# Patient Record
Sex: Female | Born: 1990
Health system: Southern US, Community
[De-identification: ages and names within clinical notes are randomized; demographics above are authoritative.]

---

## 2011-12-24 ENCOUNTER — Encounter (HOSPITAL_BASED_OUTPATIENT_CLINIC_OR_DEPARTMENT_OTHER): Payer: Self-pay | Admitting: *Deleted

## 2011-12-24 ENCOUNTER — Emergency Department (INDEPENDENT_AMBULATORY_CARE_PROVIDER_SITE_OTHER): Payer: Self-pay

## 2011-12-24 ENCOUNTER — Other Ambulatory Visit: Payer: Self-pay

## 2011-12-24 ENCOUNTER — Emergency Department (HOSPITAL_BASED_OUTPATIENT_CLINIC_OR_DEPARTMENT_OTHER)
Admission: EM | Admit: 2011-12-24 | Discharge: 2011-12-25 | Disposition: A | Discharge status: Home / Self Care | Payer: Self-pay | Attending: Emergency Medicine | Admitting: Emergency Medicine

## 2011-12-24 DIAGNOSIS — R079 Chest pain, unspecified: Secondary | ICD-10-CM | POA: Insufficient documentation

## 2011-12-24 DIAGNOSIS — R091 Pleurisy: Secondary | ICD-10-CM | POA: Insufficient documentation

## 2011-12-24 MED ORDER — TRAMADOL HCL 50 MG PO TABS
50.0000 mg | ORAL_TABLET | Freq: Once | ORAL | Status: AC
  Administered 2011-12-24: 50 mg via ORAL
  Filled 2011-12-24: qty 1

## 2011-12-24 NOTE — ED Provider Notes (Addendum)
History    This chart was scribed for Keimon Basaldua Smitty Cords, MD, MD by Smitty Pluck. The patient was seen in room MH10 and the patient's care was started at 11:19PM.   CSN: [id number]  Arrival date & time 12/24/11  2253   First MD Initiated Contact with Patient 12/24/11 2305      Chief Complaint  Patient presents with  . Chest Pain    (Consider location/radiation/quality/duration/timing/severity/associated sxs/prior treatment) Patient is a 21 y.o. female presenting with chest pain. The history is provided by the patient. No language interpreter was used.  Chest Pain The chest pain began 5 - 7 days ago. Chest pain occurs constantly. The chest pain is unchanged. The pain is associated with breathing. At its most intense, the pain is at 10/10. The pain is currently at 10/10. The severity of the pain is severe. The quality of the pain is described as pleuritic and sharp. The pain does not radiate. Chest pain is worsened by deep breathing. Pertinent negatives for primary symptoms include no fever, no syncope, no shortness of breath, no cough, no palpitations, no nausea and no dizziness.  Pertinent negatives for associated symptoms include no claudication and no near-syncope. She tried nothing for the symptoms. Risk factors include no known risk factors.  Pertinent negatives for past medical history include no Marfan's syndrome.  Pertinent negatives for family medical history include: no Marfan's syndrome in family.  Procedure history is negative for cardiac catheterization.    Hannah Riddle is a 21 y.o. female who presents to the Emergency Department complaining of constant sharp left side chest pain onset 2 days ago. Pt reports that pain occurred while eating. Denies fever, chills, recent trips, chest injury, wheezing, sore throat, diarrhea. Pt has nausea and vomiting 1x. Pt has hx of asthma. Denies taking oral contraceptives. Pt has taken alieve for pain without relief.  There is no radiation of  pain.    Past Medical History  Diagnosis Date  . Asthma     History reviewed. No pertinent past surgical history.  No family history on file.  History  Substance Use Topics  . Smoking status: Never Smoker   . Smokeless tobacco: Never Used  . Alcohol Use: No    OB History    Grav Para Term Preterm Abortions TAB SAB Ect Mult Living                  Review of Systems  Constitutional: Negative.  Negative for fever.  HENT: Negative.   Eyes: Negative.   Respiratory: Negative for cough, chest tightness and shortness of breath.   Cardiovascular: Positive for chest pain. Negative for palpitations, claudication, leg swelling, syncope and near-syncope.  Gastrointestinal: Negative.  Negative for nausea.  Genitourinary: Negative.   Musculoskeletal: Negative.   Skin: Negative.   Neurological: Negative.  Negative for dizziness.  Hematological: Negative.   Psychiatric/Behavioral: Negative.   All other systems reviewed and are negative.   10 Systems reviewed and are negative for acute change except as noted in the HPI.  Allergies  Review of patient's allergies indicates no known allergies.  Home Medications  No current outpatient prescriptions on file.  BP 118/74  Pulse 77  Temp(Src) 98.2 F (36.8 C) (Oral)  Resp 21  SpO2 98%  LMP 12/10/2011  Physical Exam  Nursing note and vitals reviewed. Constitutional: She is oriented to person, place, and time. She appears well-developed and well-nourished. No distress.  HENT:  Head: Normocephalic and atraumatic.  Mouth/Throat: Oropharynx is clear and  moist.  Eyes: Conjunctivae are normal. Pupils are equal, round, and reactive to light.  Neck: Normal range of motion. Neck supple. No tracheal deviation present.  Cardiovascular: Normal rate, regular rhythm and normal heart sounds.   No murmur heard.      +2 radial in wrist +2 dp in foot   Pulmonary/Chest: Effort normal and breath sounds normal. No respiratory distress. She has  no wheezes. She has no rales.       lungs clear of auscultation  Abdominal: Soft. Bowel sounds are normal. She exhibits no distension. There is no tenderness.  Neurological: She is alert and oriented to person, place, and time.       GCS 15  Skin: Skin is warm and dry.  Psychiatric: She has a normal mood and affect. Her behavior is normal.    ED Course  Procedures (including critical care time) .DIAGNOSTIC STUDIES: Oxygen Saturation is 98% on room air, normal by my interpretation.    COORDINATION OF CARE: 11:24PM EDP discusses pt ED treatment course with pt   Labs Reviewed - No data to display No results found.   No diagnosis found.    MDM  I personally performed the services described in this documentation, which was scribed in my presence. The recorded information has been reviewed and considered.  Symptoms constant x > 5 days with normal EKG in a young female very unlikely cardiac.  Negative DDIMEr very reassuring.   Return for chest pain shortness of breath or any concerns.Follow up with your family doctor  Patient verbalizes understanding and agrees to follow up     Date: 12/25/2011  Rate: 62  Rhythm: sinus arrhythmia  QRS Axis: normal  Intervals: PR prolonged  ST/T Wave abnormalities: normal  Conduction Disutrbances:first-degree A-V block   Narrative Interpretation:   Old EKG Reviewed: none available     Lehi Phifer K Willian Donson-Rasch, MD 12/25/11 0213  Jasmine Awe, MD 12/25/11 [phone]

## 2011-12-24 NOTE — ED Notes (Signed)
MD at bedside. 

## 2011-12-24 NOTE — ED Notes (Signed)
Pt reports left side chest pain x 2 days, worse with inspiration- hx of asthma

## 2011-12-25 DIAGNOSIS — J45909 Unspecified asthma, uncomplicated: Secondary | ICD-10-CM

## 2011-12-25 DIAGNOSIS — R112 Nausea with vomiting, unspecified: Secondary | ICD-10-CM

## 2011-12-25 DIAGNOSIS — R079 Chest pain, unspecified: Secondary | ICD-10-CM

## 2011-12-25 LAB — DIFFERENTIAL
Basophils Absolute: 0 10*3/uL (ref 0.0–0.1)
Eosinophils Relative: 2 % (ref 0–5)
Lymphocytes Relative: 42 % (ref 12–46)
Monocytes Absolute: 0.7 10*3/uL (ref 0.1–1.0)
Monocytes Relative: 12 % (ref 3–12)
Neutro Abs: 2.6 10*3/uL (ref 1.7–7.7)

## 2011-12-25 LAB — CBC
HCT: 34.2 % — ABNORMAL LOW (ref 36.0–46.0)
Hemoglobin: 10.8 g/dL — ABNORMAL LOW (ref 12.0–15.0)
MCHC: 31.6 g/dL (ref 30.0–36.0)
MCV: 77.9 fL — ABNORMAL LOW (ref 78.0–100.0)
RDW: 12.8 % (ref 11.5–15.5)
WBC: 6 10*3/uL (ref 4.0–10.5)

## 2011-12-25 LAB — BASIC METABOLIC PANEL
BUN: 22 mg/dL (ref 6–23)
CO2: 25 mEq/L (ref 19–32)
Calcium: 9.3 mg/dL (ref 8.4–10.5)
Chloride: 105 mEq/L (ref 96–112)
Creatinine, Ser: 0.8 mg/dL (ref 0.50–1.10)

## 2011-12-25 LAB — D-DIMER, QUANTITATIVE: D-Dimer, Quant: 0.46 ug/mL-FEU (ref 0.00–0.48)

## 2011-12-25 MED ORDER — TRAMADOL HCL 50 MG PO TABS: 50.0000 mg | ORAL_TABLET | Freq: Four times a day (QID) | ORAL | Status: AC | PRN

## 2011-12-25 MED ORDER — IBUPROFEN 600 MG PO TABS: 600.0000 mg | ORAL_TABLET | Freq: Four times a day (QID) | ORAL | Status: AC | PRN

## 2011-12-25 NOTE — ED Notes (Signed)
rx x 2 given for tramadol and ibuprofen

## 2011-12-25 NOTE — Discharge Instructions (Signed)
Pleurisy  Pleurisy is an inflammation and swelling of the lining of the lungs. It usually is the result of an underlying infection or other disease. Because of this inflammation, it hurts to breathe. It is aggravated by coughing or deep breathing. The primary goal in treating pleurisy is to diagnose and treat the condition that caused it.   HOME CARE INSTRUCTIONS    Only take over-the-counter or prescription medicines for pain, discomfort, or fever as directed by your caregiver.   If medications which kill germs (antibiotics) were prescribed, take the entire course. Even if you are feeling better, you need to take them.   Use a cool mist vaporizer to help loosen secretions. This is so the secretions can be coughed up more easily.  SEEK MEDICAL CARE IF:    Your pain is not controlled with medication or is increasing.   You have an increase inpus like (purulent) secretions brought up with coughing.  SEEK IMMEDIATE MEDICAL CARE IF:    You have blue or dark lips, fingernails, or toenails.   You begin coughing up blood.   You have increased difficulty breathing.   You have continuing pain unrelieved by medicine or lasting more than 1 week.   You have pain that radiates into your neck, arms, or jaw.   You develop increased shortness of breath or wheezing.   You develop a fever, rash, vomiting, fainting, or other serious complaints.  Document Released: 09/25/2005 Document Revised: 09/14/2011 Document Reviewed: 04/26/2007  [employer] Patient Information 2012 [employer].

## 2014-01-09 ENCOUNTER — Emergency Department (HOSPITAL_BASED_OUTPATIENT_CLINIC_OR_DEPARTMENT_OTHER)
Admission: EM | Admit: 2014-01-09 | Discharge: 2014-01-09 | Disposition: A | Discharge status: Home / Self Care | Payer: No Typology Code available for payment source | Attending: Emergency Medicine | Admitting: Emergency Medicine

## 2014-01-09 ENCOUNTER — Encounter (HOSPITAL_BASED_OUTPATIENT_CLINIC_OR_DEPARTMENT_OTHER): Payer: Self-pay | Admitting: Emergency Medicine

## 2014-01-09 DIAGNOSIS — S40029A Contusion of unspecified upper arm, initial encounter: Secondary | ICD-10-CM | POA: Insufficient documentation

## 2014-01-09 DIAGNOSIS — J45909 Unspecified asthma, uncomplicated: Secondary | ICD-10-CM | POA: Insufficient documentation

## 2014-01-09 DIAGNOSIS — S199XXA Unspecified injury of neck, initial encounter: Secondary | ICD-10-CM

## 2014-01-09 DIAGNOSIS — Z79899 Other long term (current) drug therapy: Secondary | ICD-10-CM | POA: Insufficient documentation

## 2014-01-09 DIAGNOSIS — Y9389 Activity, other specified: Secondary | ICD-10-CM | POA: Insufficient documentation

## 2014-01-09 DIAGNOSIS — IMO0002 Reserved for concepts with insufficient information to code with codable children: Secondary | ICD-10-CM | POA: Insufficient documentation

## 2014-01-09 DIAGNOSIS — S0993XA Unspecified injury of face, initial encounter: Secondary | ICD-10-CM | POA: Insufficient documentation

## 2014-01-09 DIAGNOSIS — Y92410 Unspecified street and highway as the place of occurrence of the external cause: Secondary | ICD-10-CM | POA: Insufficient documentation

## 2014-01-09 MED ORDER — DIAZEPAM 5 MG PO TABS: 5.0000 mg | ORAL_TABLET | Freq: Two times a day (BID) | ORAL | Status: DC
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
## 2014-08-08 ENCOUNTER — Emergency Department (HOSPITAL_BASED_OUTPATIENT_CLINIC_OR_DEPARTMENT_OTHER)
Admission: EM | Admit: 2014-08-08 | Discharge: 2014-08-09 | Disposition: A | Discharge status: Home / Self Care | Payer: No Typology Code available for payment source | Attending: Emergency Medicine | Admitting: Emergency Medicine

## 2014-08-08 ENCOUNTER — Encounter (HOSPITAL_BASED_OUTPATIENT_CLINIC_OR_DEPARTMENT_OTHER): Payer: Self-pay | Admitting: Emergency Medicine

## 2014-08-08 DIAGNOSIS — J45901 Unspecified asthma with (acute) exacerbation: Secondary | ICD-10-CM | POA: Insufficient documentation

## 2014-08-08 DIAGNOSIS — Z79899 Other long term (current) drug therapy: Secondary | ICD-10-CM | POA: Insufficient documentation

## 2014-08-08 MED ORDER — IPRATROPIUM-ALBUTEROL 0.5-2.5 (3) MG/3ML IN SOLN
3.0000 mL | RESPIRATORY_TRACT | Status: DC
  Administered 2014-08-08: 3 mL via RESPIRATORY_TRACT
  Filled 2014-08-08: qty 3

## 2014-08-08 MED ORDER — ALBUTEROL SULFATE (2.5 MG/3ML) 0.083% IN NEBU
2.5000 mg | INHALATION_SOLUTION | Freq: Once | RESPIRATORY_TRACT | Status: AC
  Administered 2014-08-08: 2.5 mg via RESPIRATORY_TRACT
  Filled 2014-08-08: qty 3

## 2014-08-08 NOTE — ED Provider Notes (Signed)
CSN: [id number]     Arrival date & time 08/08/14  2321 History  This chart was scribed for Hanley SeamenJohn L Azure Budnick, MD by Annye AsaAnna Dorsett, ED Scribe. This patient was seen in room MH09/MH09 and the patient's care was started at 11:35 PM.    Chief Complaint  Patient presents with  . Shortness of Breath   HPI  HPI Comments: Hannah Riddle is a 23 y.o. female with past medical history of asthma who presents to the Emergency Department complaining of an asthma flare up. Patient reports that she is out of her albuterol inhaler at present (as of this morning). She has been having an asthma attack throughout the day; she reports coughing to catch her breath and tightness in her chest. These symptoms are characterized as typical based on her prior asthma attacks, of moderate severity. She denies fever, productive cough, vomiting, diarrhea, or any other pain at present.   Patient works in a smoky environment.   Past Medical History  Diagnosis Date  . Asthma    History reviewed. No pertinent past surgical history. History reviewed. No pertinent family history. History  Substance Use Topics  . Smoking status: Never Smoker   . Smokeless tobacco: Never Used  . Alcohol Use: No   OB History    No data available     Review of Systems  A complete 10 system review of systems was obtained and all systems are negative except as noted in the HPI and PMH.   Allergies  Review of patient's allergies indicates no known allergies.  Home Medications   Prior to Admission medications   Medication Sig Start Date End Date Taking? Authorizing Provider  albuterol (PROVENTIL HFA;VENTOLIN HFA) 108 (90 BASE) MCG/ACT inhaler Inhale into the lungs every 6 (six) hours as needed for wheezing or shortness of breath.   Yes Historical Provider, MD  diazepam (VALIUM) 5 MG tablet Take 1 tablet (5 mg total) by mouth 2 (two) times daily. 01/09/14   Gerhard Munchobert Lockwood, MD   BP 120/83 mmHg  Pulse 69  Temp(Src) 98 F (36.7 C) (Oral)   Resp 18  Ht 5\' 2"  (1.575 m)  Wt 180 lb (81.647 kg)  BMI 32.91 kg/m2  SpO2 95%  LMP 07/27/2014 Physical Exam  Nursing note and vitals reviewed.  General: Well-developed, well-nourished female in no acute distress; appearance consistent with age of record HENT: normocephalic; atraumatic Eyes: pupils equal, round and reactive to light; extraocular muscles intact Neck: supple Heart: regular rate and rhythm; no murmurs, rubs or gallops Lungs: decreased air movement without wheezing  Abdomen: soft; nondistended; nontender; no masses or hepatosplenomegaly; bowel sounds present Extremities: No deformity; full range of motion; pulses normal Neurologic: Awake, alert and oriented; motor function intact in all extremities and symmetric; no facial droop Skin: Warm and dry Psychiatric: Normal mood and affect  ED Course  Procedures   DIAGNOSTIC STUDIES: Oxygen Saturation is 96% on RA, adequate by my interpretation.    COORDINATION OF CARE: 11:37 PM Discussed treatment plan with pt at bedside and pt agreed to plan.   MDM   Final diagnoses:  Asthma exacerbation   I personally performed the services described in this documentation, which was scribed in my presence. The recorded information has been reviewed and is accurate.    Hanley SeamenJohn L Sharman Garrott, MD 08/15/14 [phone]

## 2014-08-08 NOTE — ED Notes (Signed)
Pt reports cough, shortness of breath for one week. Pt states she is out of asthma meds and this is a flare up.

## 2014-08-09 MED ORDER — DEXAMETHASONE SODIUM PHOSPHATE 4 MG/ML IJ SOLN
INTRAMUSCULAR | Status: AC
  Filled 2014-08-09: qty 3

## 2014-08-09 MED ORDER — ALBUTEROL SULFATE HFA 108 (90 BASE) MCG/ACT IN AERS
2.0000 | INHALATION_SPRAY | Freq: Once | RESPIRATORY_TRACT | Status: DC
  Filled 2014-08-09: qty 6.7

## 2014-08-09 NOTE — Patient Instructions (Signed)
Instructed patient on the proper use of administering albuterol mdi via aerochamber patient tolerated well 

## 2014-08-09 NOTE — ED Provider Notes (Signed)
See Epic downtime paperwork.  Hannah SeamenJohn L [name], MD 08/09/14 [phone]

## 2015-06-07 ENCOUNTER — Encounter (HOSPITAL_BASED_OUTPATIENT_CLINIC_OR_DEPARTMENT_OTHER): Payer: Self-pay | Admitting: *Deleted

## 2015-06-07 ENCOUNTER — Emergency Department (HOSPITAL_BASED_OUTPATIENT_CLINIC_OR_DEPARTMENT_OTHER)
Admission: EM | Admit: 2015-06-07 | Discharge: 2015-06-07 | Disposition: A | Discharge status: Home / Self Care | Payer: Worker's Compensation | Attending: Emergency Medicine | Admitting: Emergency Medicine

## 2015-06-07 ENCOUNTER — Emergency Department (HOSPITAL_BASED_OUTPATIENT_CLINIC_OR_DEPARTMENT_OTHER): Payer: Worker's Compensation

## 2015-06-07 DIAGNOSIS — Y998 Other external cause status: Secondary | ICD-10-CM | POA: Insufficient documentation

## 2015-06-07 DIAGNOSIS — S6992XA Unspecified injury of left wrist, hand and finger(s), initial encounter: Secondary | ICD-10-CM | POA: Insufficient documentation

## 2015-06-07 DIAGNOSIS — M79642 Pain in left hand: Secondary | ICD-10-CM

## 2015-06-07 DIAGNOSIS — Y9389 Activity, other specified: Secondary | ICD-10-CM | POA: Diagnosis not present

## 2015-06-07 DIAGNOSIS — J45909 Unspecified asthma, uncomplicated: Secondary | ICD-10-CM | POA: Insufficient documentation

## 2015-06-07 DIAGNOSIS — Z79899 Other long term (current) drug therapy: Secondary | ICD-10-CM | POA: Insufficient documentation

## 2015-06-07 DIAGNOSIS — X58XXXA Exposure to other specified factors, initial encounter: Secondary | ICD-10-CM | POA: Insufficient documentation

## 2015-06-07 DIAGNOSIS — Y9289 Other specified places as the place of occurrence of the external cause: Secondary | ICD-10-CM | POA: Insufficient documentation

## 2015-06-07 NOTE — Discharge Instructions (Signed)
You were evaluated for your wrist and hand pain. There does not appear to be an emergent cause for your symptoms at this time. Your x-rays were negative for any fractures or dislocations. You may continue to use ice, Aleve at home. Be sure to keep your hand elevated to help with the discomfort. Follow-up with your doctors as needed. Return to ED for new or worsening symptoms.  Musculoskeletal Pain Musculoskeletal pain is muscle and boney aches and pains. These pains can occur in any part of the body. Your caregiver may treat you without knowing the cause of the pain. They may treat you if blood or urine tests, X-rays, and other tests were normal.  CAUSES There is often not a definite cause or reason for these pains. These pains may be caused by a type of germ (virus). The discomfort may also come from overuse. Overuse includes working out too hard when your body is not fit. Boney aches also come from weather changes. Bone is sensitive to atmospheric pressure changes. HOME CARE INSTRUCTIONS   Ask when your test results will be ready. Make sure you get your test results.  Only take over-the-counter or prescription medicines for pain, discomfort, or fever as directed by your caregiver. If you were given medications for your condition, do not drive, operate machinery or power tools, or sign legal documents for 24 hours. Do not drink alcohol. Do not take sleeping pills or other medications that may interfere with treatment.  Continue all activities unless the activities cause more pain. When the pain lessens, slowly resume normal activities. Gradually increase the intensity and duration of the activities or exercise.  During periods of severe pain, bed rest may be helpful. Lay or sit in any position that is comfortable.  Putting ice on the injured area.  Put ice in a bag.  Place a towel between your skin and the bag.  Leave the ice on for 15 to 20 minutes, 3 to 4 times a day.  Follow up with your  caregiver for continued problems and no reason can be found for the pain. If the pain becomes worse or does not go away, it may be necessary to repeat tests or do additional testing. Your caregiver may need to look further for a possible cause. SEEK IMMEDIATE MEDICAL CARE IF:  You have pain that is getting worse and is not relieved by medications.  You develop chest pain that is associated with shortness or breath, sweating, feeling sick to your stomach (nauseous), or throw up (vomit).  Your pain becomes localized to the abdomen.  You develop any new symptoms that seem different or that concern you. MAKE SURE YOU:   Understand these instructions.  Will watch your condition.  Will get help right away if you are not doing well or get worse. Document Released: 09/25/2005 Document Revised: 12/18/2011 Document Reviewed: 05/30/2013 [facility] Patient Information 2015 [place], [place]. This information is not intended to replace advice given to you by your health care provider. Make sure you discuss any questions you have with your health care provider.

## 2015-06-07 NOTE — ED Notes (Signed)
States she was getting ice out of an ice machine yesterday and the machine released ice while her hand was in the machine. Pain to her left hand.

## 2015-06-07 NOTE — ED Provider Notes (Signed)
CSN: [id number]     Arrival date & time 06/07/15  1119 History   First MD Initiated Contact with Patient 06/07/15 1134     Chief Complaint  Patient presents with  . Hand Injury     (Consider location/radiation/quality/duration/timing/severity/associated sxs/prior Treatment) HPI Hannah Riddle is a 24 y.o. female who comes in for evaluation of left hand pain. Patient states she was reaching her hand into a commercial ice machine yesterday when the machine discharged and released more ice on top of her hand. She reports associated pain around her thumb. She has taken Aleve with some relief of her symptoms and is also use ice packs which have helped with the discomfort. She rates her discomfort now as a 5/10. Denies any numbness, weakness. She does report decreased range of motion secondary to pain only. No other aggravating or modifying factors.  Past Medical History  Diagnosis Date  . Asthma    History reviewed. No pertinent past surgical history. No family history on file. Social History  Substance Use Topics  . Smoking status: Never Smoker   . Smokeless tobacco: Never Used  . Alcohol Use: No   OB History    No data available     Review of Systems A 10 point review of systems was completed and was negative except for pertinent positives and negatives as mentioned in the history of present illness     Allergies  Review of patient's allergies indicates no known allergies.  Home Medications   Prior to Admission medications   Medication Sig Start Date End Date Taking? Authorizing Provider  albuterol (PROVENTIL HFA;VENTOLIN HFA) 108 (90 BASE) MCG/ACT inhaler Inhale into the lungs every 6 (six) hours as needed for wheezing or shortness of breath.    Historical Provider, MD  diazepam (VALIUM) 5 MG tablet Take 1 tablet (5 mg total) by mouth 2 (two) times daily. 01/09/14   Gerhard Munch, MD   BP 115/70 mmHg  Pulse 73  Resp 16  Ht 5\' 2"  (1.575 m)  Wt 180 lb (81.647 kg)  BMI 32.91  kg/m2  SpO2 97%  LMP 06/04/2015 Physical Exam  Constitutional:  Awake, alert, nontoxic appearance.  HENT:  Head: Atraumatic.  Eyes: Right eye exhibits no discharge. Left eye exhibits no discharge.  Neck: Neck supple.  Pulmonary/Chest: Effort normal. She exhibits no tenderness.  Abdominal: Soft. There is no tenderness. There is no rebound.  Musculoskeletal: She exhibits no tenderness.  Baseline ROM, no obvious new focal weakness. Very mild swelling noted to left hand. Mild Tenderness to left thumb MCP. No erythema or deformities. Range of motion is decreased secondary to pain only. Motor is intact. Sensation intact to light touch. Distal pulses intact and equal bilaterally with brisk cap refill.  Neurological:  Mental status and motor strength appears baseline for patient and situation.  Skin: No rash noted.  Psychiatric: She has a normal mood and affect.  Nursing note and vitals reviewed.   ED Course  Procedures (including critical care time) Labs Review Labs Reviewed - No data to display  Imaging Review Dg Hand Complete Left  06/07/2015   CLINICAL DATA:  Status post something falling on the left hand last night with pain and swelling at the base of the thumb.  EXAM: LEFT HAND - COMPLETE 3+ VIEW  COMPARISON:  None.  FINDINGS: There is no evidence of fracture or dislocation. There is no evidence of arthropathy or other focal bone abnormality. Soft tissues are unremarkable.  IMPRESSION: Negative.   Electronically Signed  By: Sherian Rein M.D.   On: 06/07/2015 11:54   I have personally reviewed and evaluated these images and lab results as part of my medical decision-making.   EKG Interpretation None     Meds given in ED:  Medications - No data to display  Discharge Medication List as of 06/07/2015 12:15 PM     Filed Vitals:   06/07/15 1125 06/07/15 1128  BP:  115/70  Pulse:  73  Resp: 20 16  Height:  (1.575 m)   Weight: 180 lb (81.647 kg)   SpO2:  97%    MDM   Vitals stable - WNL -afebrile Pt resting comfortably in ED. PE--on physical exam as above and is grossly non-concerning. Imaging--plain films of left hand show no acute osseous abnormalities, dislocations.  DDX--patient with left hand pain after ice falling on it. No evidence of acute or emergent pathology. Encouraged continued use of Tylenol and Motrin at home for discomfort as well as elevation to help with any swelling.  I discussed all relevant lab findings and imaging results with pt and they verbalized understanding. Discussed f/u with PCP within 48 hrs and return precautions, pt very amenable to plan.  Final diagnoses:  Hand pain, left        Joycie Peek, PA-C 06/07/15 1415  Rolan Bucco, MD 06/07/15 1415

## 2016-05-21 ENCOUNTER — Encounter (HOSPITAL_BASED_OUTPATIENT_CLINIC_OR_DEPARTMENT_OTHER): Payer: Self-pay | Admitting: Emergency Medicine

## 2016-05-21 ENCOUNTER — Emergency Department (HOSPITAL_BASED_OUTPATIENT_CLINIC_OR_DEPARTMENT_OTHER)
Admission: EM | Admit: 2016-05-21 | Discharge: 2016-05-21 | Disposition: A | Discharge status: Home / Self Care | Payer: Self-pay | Attending: Emergency Medicine | Admitting: Emergency Medicine

## 2016-05-21 DIAGNOSIS — J45909 Unspecified asthma, uncomplicated: Secondary | ICD-10-CM | POA: Insufficient documentation

## 2016-05-21 DIAGNOSIS — L02412 Cutaneous abscess of left axilla: Secondary | ICD-10-CM | POA: Insufficient documentation

## 2016-05-21 DIAGNOSIS — L0291 Cutaneous abscess, unspecified: Secondary | ICD-10-CM

## 2016-05-21 MED ORDER — OXYCODONE-ACETAMINOPHEN 5-325 MG PO TABS
1.0000 | ORAL_TABLET | Freq: Once | ORAL | Status: AC
  Administered 2016-05-21: 1 via ORAL
  Filled 2016-05-21: qty 1

## 2016-05-21 MED ORDER — CEPHALEXIN 500 MG PO CAPS: 500.0000 mg | ORAL_CAPSULE | Freq: Two times a day (BID) | ORAL | 0 refills | Status: AC

## 2016-05-21 MED ORDER — LIDOCAINE-EPINEPHRINE (PF) 2 %-1:200000 IJ SOLN
10.0000 mL | Freq: Once | INTRAMUSCULAR | Status: AC
  Administered 2016-05-21: 10 mL
  Filled 2016-05-21: qty 10

## 2016-05-21 MED ORDER — IBUPROFEN 600 MG PO TABS: 600.0000 mg | ORAL_TABLET | Freq: Four times a day (QID) | ORAL | 0 refills | Status: DC | PRN

## 2016-05-21 NOTE — ED Notes (Signed)
Pt left axilla dressed with gauze and tape.

## 2016-05-21 NOTE — ED Provider Notes (Signed)
[facility] Provider Note   CSN: [id number] Arrival date & time: 05/21/16  1716  First Provider Contact:  First MD Initiated Contact with Patient 05/21/16 1745      By signing my name below, I, Emmanuella Mensah, attest that this documentation has been prepared under the direction and in the presence of Melburn Hake, PA-C. Electronically Signed: Angelene Giovanni, ED Scribe. 05/21/16. 6:03 PM.    History   Chief Complaint Chief Complaint  Patient presents with  . Abscess    HPI Comments: Hannah Riddle is a 25 y.o. female who presents to the Emergency Department complaining of gradually worsening moderately painful area of swelling to her left axilla onset yesterday. No drainage. She reports associated numbness to her upper arm. No alleviating factors noted. Pt has not tried any medications PTA. 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 She denies any fever, chills, n/v, generalized rash, or any open wounds.   The history is provided by the patient. No language interpreter was used.    Past Medical History:  Diagnosis Date  . Asthma     There are no active problems to display for this patient.   History reviewed. No pertinent surgical history.  OB History    No data available       Home Medications    Prior to Admission medications   Medication Sig Start Date End Date Taking? Authorizing Provider  albuterol (PROVENTIL HFA;VENTOLIN HFA) 108 (90 BASE) MCG/ACT inhaler Inhale into the lungs every 6 (six) hours as needed for wheezing or shortness of breath.    Historical Provider, MD  cephALEXin (KEFLEX) 500 MG capsule Take 1 capsule (500 mg total) by mouth 2 (two) times daily. 05/21/16 05/28/16  Barrett Henle, PA-C  diazepam (VALIUM) 5 MG tablet Take 1 tablet (5 mg total) by mouth 2 (two) times daily. 01/09/14   Gerhard Munch, MD  ibuprofen (ADVIL,MOTRIN) 600 MG tablet Take 1 tablet (600 mg total) by mouth every 6  (six) hours as needed. 05/21/16   Barrett Henle, PA-C    Family History History reviewed. No pertinent family history.  Social History Social History  Substance Use Topics  . Smoking status: Never Smoker  . Smokeless tobacco: Never Used  . Alcohol use No     Allergies   Review of patient's allergies indicates no known allergies.   Review of Systems Review of Systems  Constitutional: Negative for chills and fever.  Gastrointestinal: Negative for nausea and vomiting.  Skin: Positive for wound. Negative for rash.     Physical Exam Updated Vital Signs BP 111/75 (BP Location: Right Arm)   Pulse 87   Temp 98.1 F (36.7 C) (Oral)   Resp 16   Ht  (1.575 m)   Wt 90.7 kg   LMP 05/07/2016   SpO2 98%   BMI 36.58 kg/m   Physical Exam  Constitutional: She is oriented to person, place, and time. She appears well-developed and well-nourished. No distress.  HENT:  Head: Normocephalic and atraumatic.  Eyes: Conjunctivae and EOM are normal. Right eye exhibits no discharge. Left eye exhibits no discharge. No scleral icterus.  Neck: Neck supple. No tracheal deviation present.  Cardiovascular: Normal rate.   Pulmonary/Chest: Effort normal. No respiratory distress.  Musculoskeletal: Normal range of motion.  Full ROM of LUE  2+ radial pulses Sensation intact  Neurological: She is alert and oriented to person, place, and time.  Skin: Skin is warm and  dry.  3 by 2 cm area of swelling with fluctuance noted to left axilla. No surrounding erythema, warmth, or drainage. TTP.   Psychiatric: She has a normal mood and affect. Her behavior is normal.  Nursing note and vitals reviewed.    ED Treatments / Results  DIAGNOSTIC STUDIES: Oxygen Saturation is 91% on RA, low by my interpretation.    COORDINATION OF CARE: 5:50 PM- Pt advised of plan for treatment and pt agrees. Pt will receive pain medication for further evaluation of the possible abscess for I&D. Will provide  resources for Dermatology follow up.    Procedures .Marland Kitchen.Incision and Drainage Date/Time: 05/21/2016 8:14 PM Performed by: Barrett HenleNADEAU, NICOLE ELIZABETH Authorized by: Barrett HenleNADEAU, NICOLE ELIZABETH   Consent:    Consent obtained:  Verbal   Consent given by:  Patient Location:    Type:  Abscess   Size:  3x2cm   Location: left axilla. Pre-procedure details:    Skin preparation:  Betadine Anesthesia (see MAR for exact dosages):    Anesthesia method:  Local infiltration   Local anesthetic:  Lidocaine 2% WITH epi Procedure type:    Complexity:  Complex Procedure details:    Incision types:  Single straight   Incision depth:  Dermal   Scalpel blade:  11   Wound management:  Probed and deloculated and irrigated with saline   Drainage:  Bloody and purulent   Drainage amount:  Moderate   Wound treatment:  Wound left open   Packing materials:  None Post-procedure details:    Patient tolerance of procedure:  Tolerated well, no immediate complications   (including critical care time)  Medications Ordered in ED Medications  oxyCODONE-acetaminophen (PERCOCET/ROXICET) 5-325 MG per tablet 1 tablet (1 tablet Oral Given 05/21/16 1842)  lidocaine-EPINEPHrine (XYLOCAINE W/EPI) 2 %-1:200000 (PF) injection 10 mL (10 mLs Infiltration Given 05/21/16 1846)     Initial Impression / Assessment and Plan / ED Course  Melburn HakeNicole Nadeau, PA-C has reviewed the triage vital signs and the nursing notes.  Pertinent labs & imaging results that were available during my care of the patient were reviewed by me and considered in my medical decision making (see chart for details).  Clinical Course    Patient with skin abscess amenable to incision and drainage.  Abscess was not large enough to warrant packing or drain,  wound recheck in 2 days. Encouraged home warm soaks and flushing.  No signs of cellulitis to surrounding skin. Due to patient with history of recurrent abscesses, we'll discharge patient home with antibiotics  and outpatient dermatology follow-up. Will d/c to home.    Final Clinical Impressions(s) / ED Diagnoses   Final diagnoses:  Abscess   I personally performed the services described in this documentation, which was scribed in my presence. The recorded information has been reviewed and is accurate.   New Prescriptions Discharge Medication List as of 05/21/2016  7:12 PM    START taking these medications   Details  cephALEXin (KEFLEX) 500 MG capsule Take 1 capsule (500 mg total) by mouth 2 (two) times daily., Starting Sun 05/21/2016, Until Sun 05/28/2016, Print    ibuprofen (ADVIL,MOTRIN) 600 MG tablet Take 1 tablet (600 mg total) by mouth every 6 (six) hours as needed., Starting Sun 05/21/2016, Print         Satira Sarkicole Elizabeth Challenge-BrownsvilleNadeau, PA-C 05/21/16 2015    Rolland PorterMark James, MD 05/23/16 1153

## 2016-05-21 NOTE — Discharge Instructions (Signed)
Take your antibiotics as prescribed and to leave completed the prescription. Keep wound clean using antibacterial soap and water, pat dry. You may use warm compresses. I recommend following up with the dermatology clinic listed above a continued to have abscesses. Return to emergency department if symptoms worsen or new onset of fever, worsening redness, swelling, warmth or drainage, numbness, tingling, weakness.

## 2016-05-21 NOTE — ED Triage Notes (Signed)
Pt in c/o abscess to L underarm, closed and non draining. Pt alert, interactive, in NAD.

## 2016-05-21 NOTE — ED Notes (Signed)
Pt made aware to return if symptoms worsen or if any life threatening symptoms occur.   

## 2016-10-15 IMAGING — CR DG HAND COMPLETE 3+V*L*
3 series · 3 of 3 positions shown · non-contrast
Comparison: None.

CLINICAL DATA: Status post something falling on the left hand last
night with pain and swelling at the base of the thumb.

EXAM:
LEFT HAND - COMPLETE 3+ VIEW

[x hand pa left]
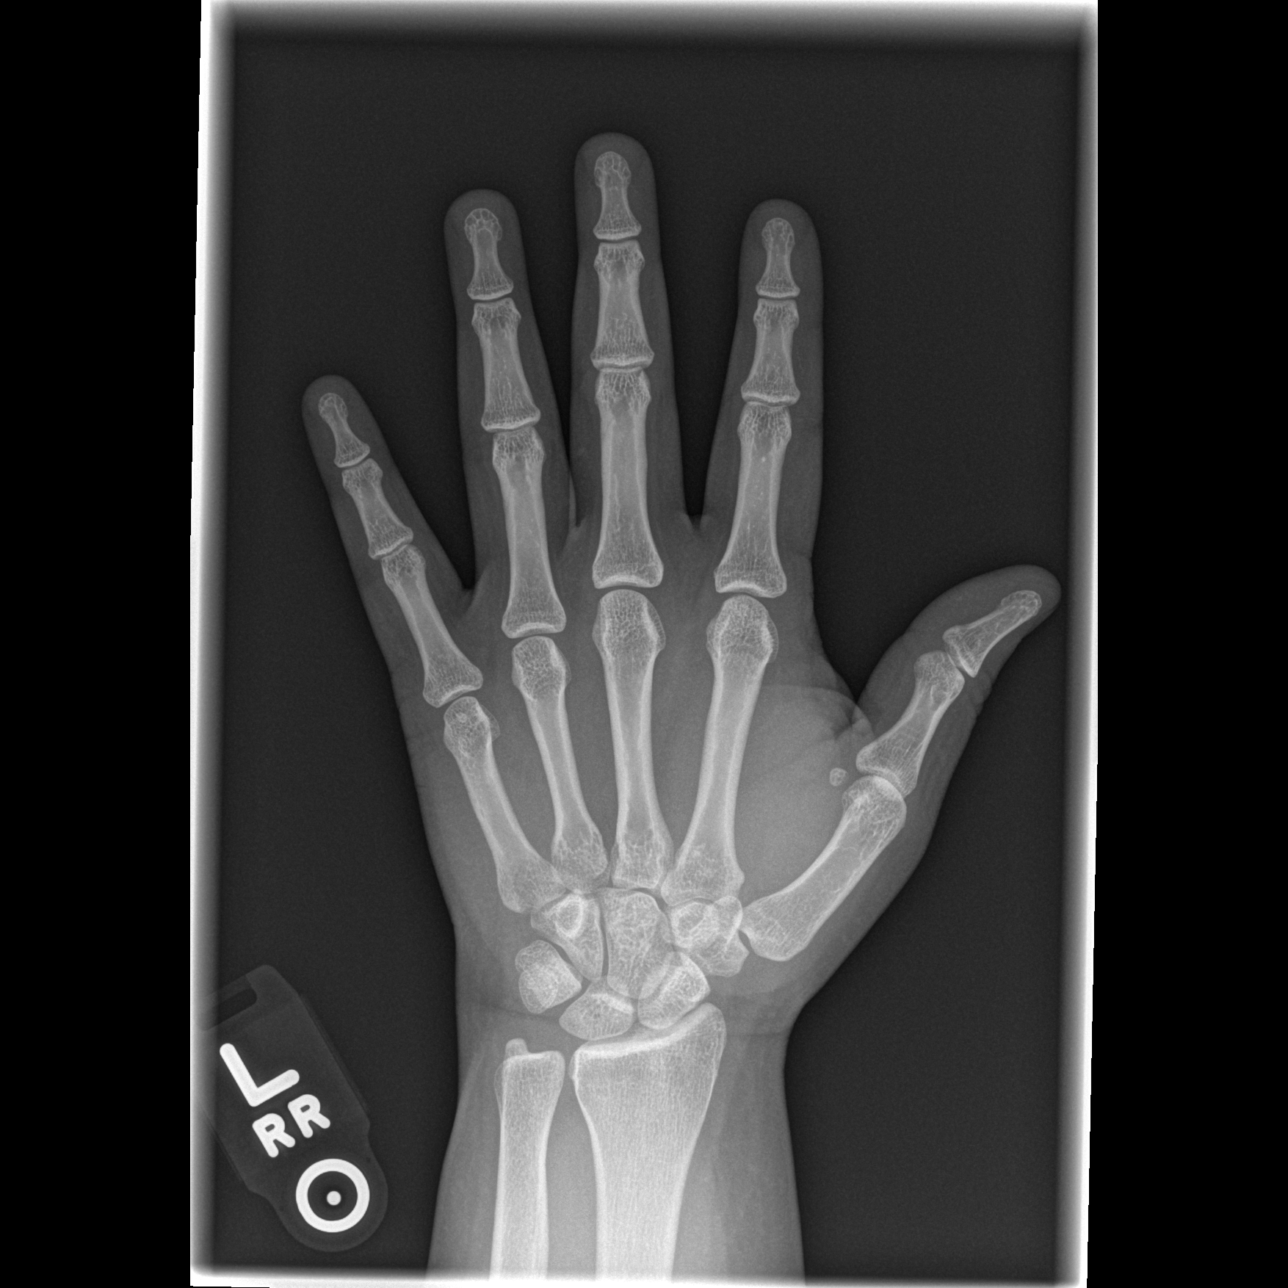

[x hand oblique left]
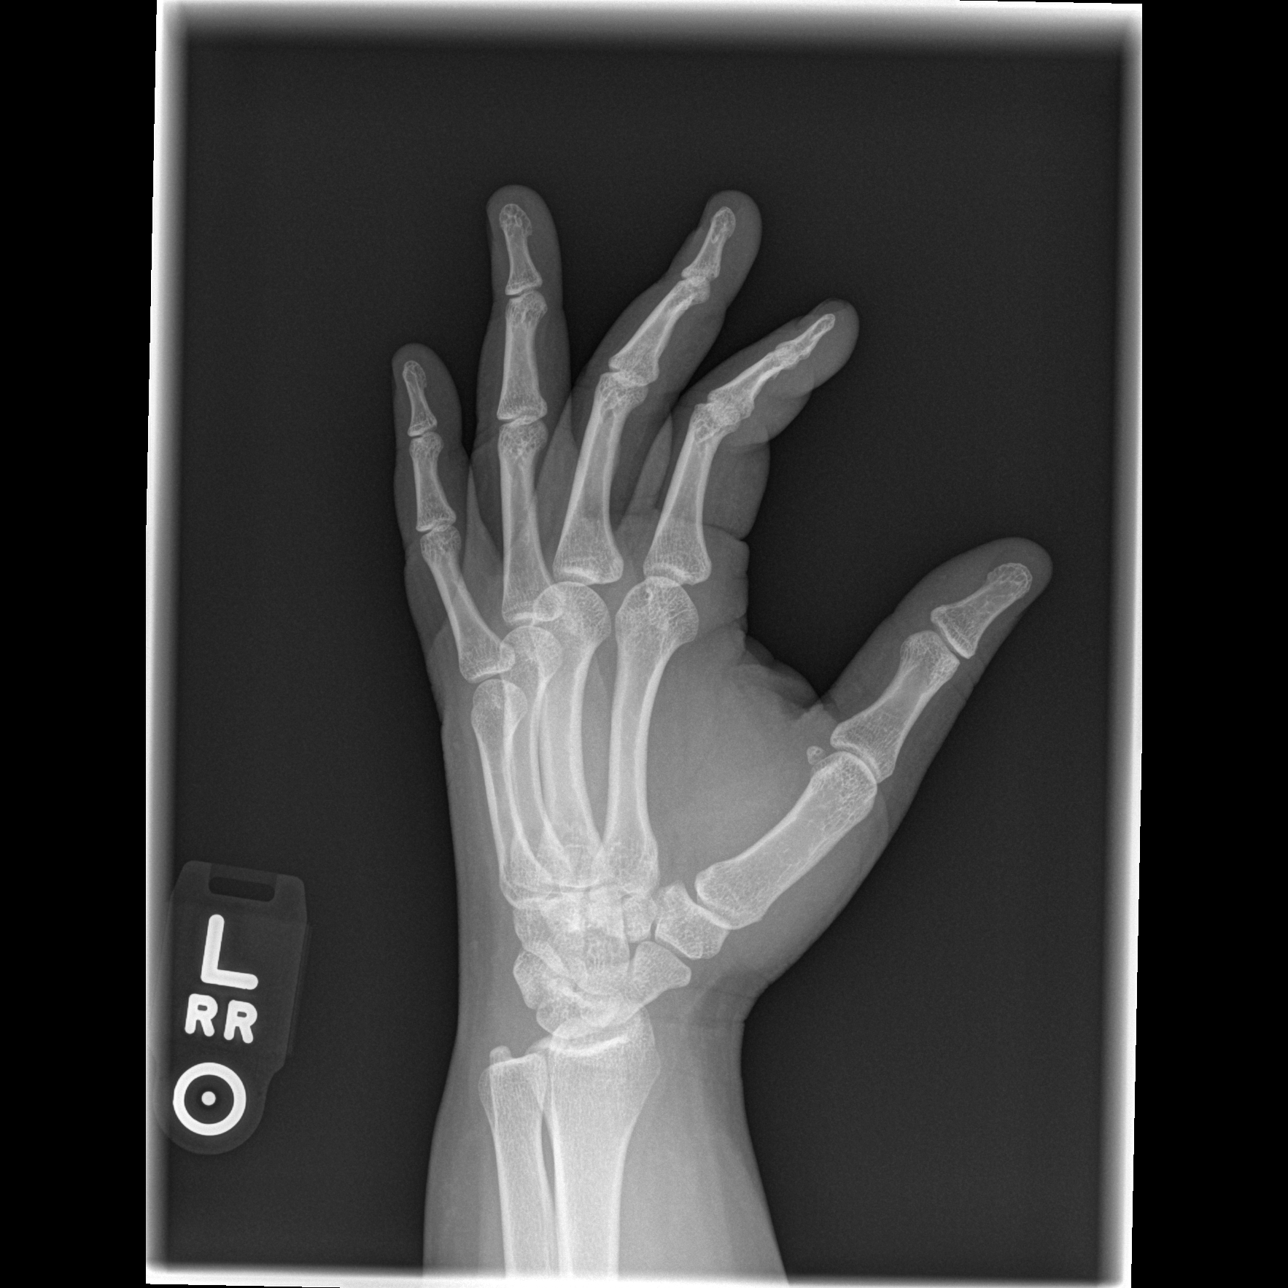

[x hand lat left]
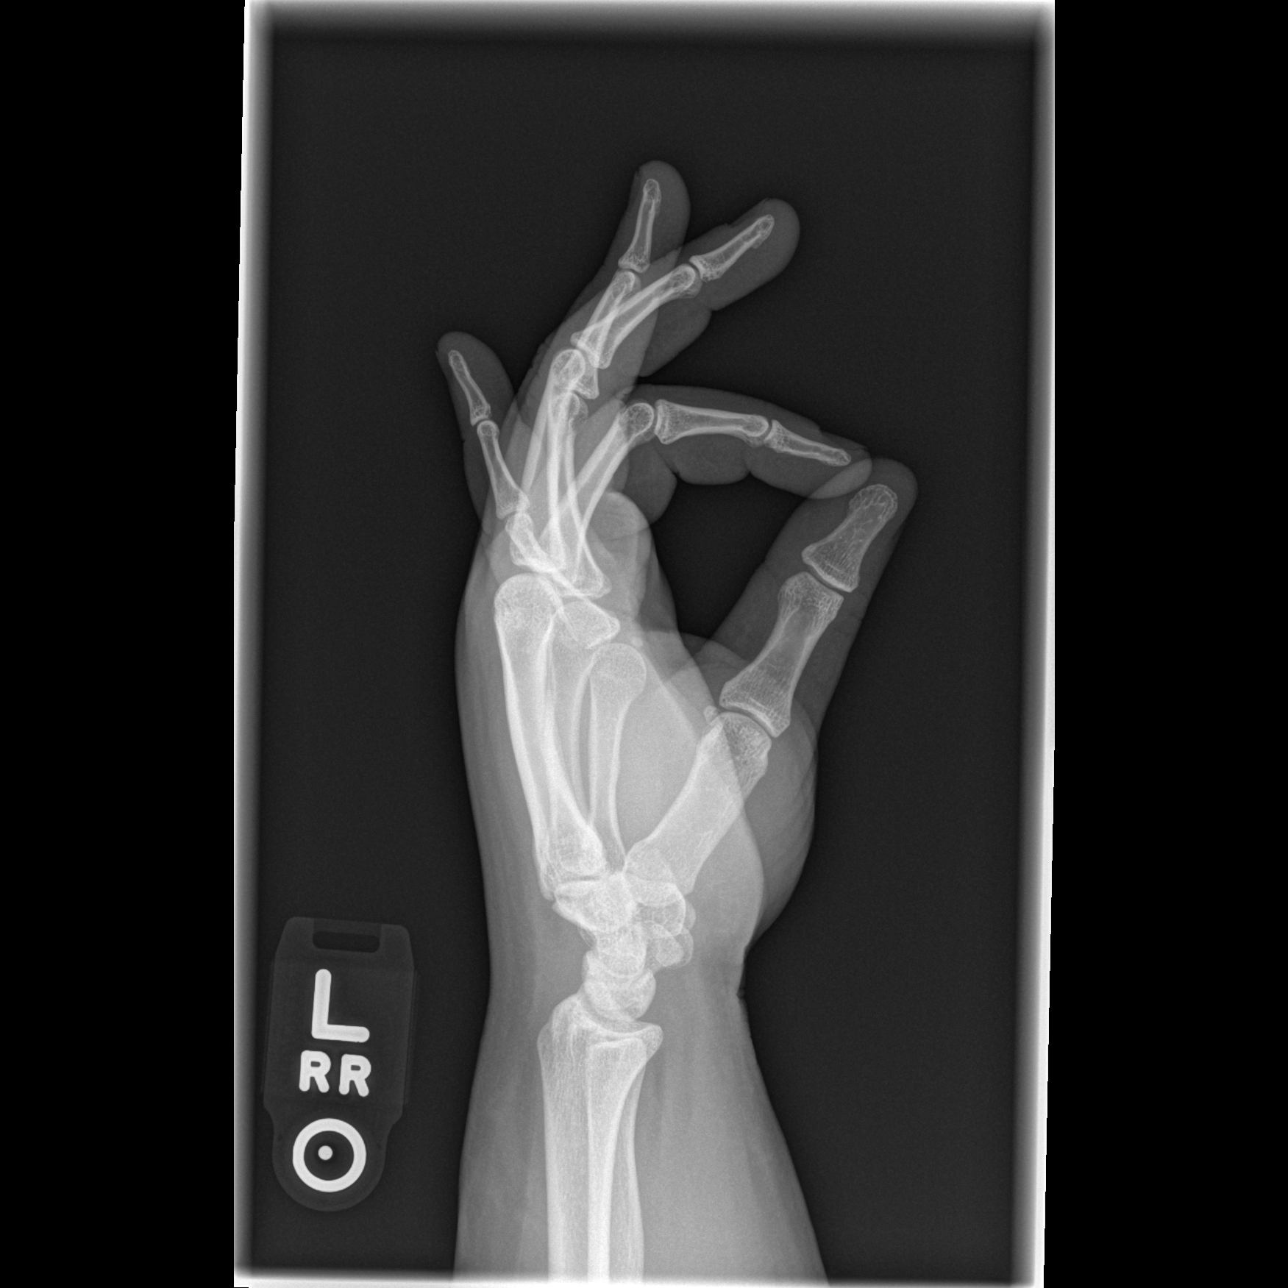

[3 of 3 positions shown; findings below may reference images not displayed]

FINDINGS: There is no evidence of fracture or dislocation. There is no
evidence of arthropathy or other focal bone abnormality. Soft
tissues are unremarkable.
IMPRESSION: Negative.

## 2017-03-09 ENCOUNTER — Emergency Department (HOSPITAL_BASED_OUTPATIENT_CLINIC_OR_DEPARTMENT_OTHER)
Admission: EM | Admit: 2017-03-09 | Discharge: 2017-03-09 | Disposition: A | Discharge status: Home / Self Care | Payer: Self-pay | Attending: Emergency Medicine | Admitting: Emergency Medicine

## 2017-03-09 ENCOUNTER — Encounter (HOSPITAL_BASED_OUTPATIENT_CLINIC_OR_DEPARTMENT_OTHER): Payer: Self-pay

## 2017-03-09 DIAGNOSIS — L02412 Cutaneous abscess of left axilla: Secondary | ICD-10-CM | POA: Insufficient documentation

## 2017-03-09 DIAGNOSIS — J45909 Unspecified asthma, uncomplicated: Secondary | ICD-10-CM | POA: Insufficient documentation

## 2017-03-09 LAB — CBG MONITORING, ED: GLUCOSE-CAPILLARY: 162 mg/dL — AB (ref 65–99)

## 2017-03-09 MED ORDER — MORPHINE SULFATE (PF) 4 MG/ML IV SOLN
4.0000 mg | Freq: Once | INTRAVENOUS | Status: AC
  Administered 2017-03-09: 4 mg via INTRAMUSCULAR
  Filled 2017-03-09: qty 1

## 2017-03-09 MED ORDER — DOXYCYCLINE HYCLATE 100 MG PO TABS
100.0000 mg | ORAL_TABLET | Freq: Once | ORAL | Status: AC
  Administered 2017-03-09: 100 mg via ORAL
  Filled 2017-03-09: qty 1

## 2017-03-09 MED ORDER — LIDOCAINE-EPINEPHRINE 1 %-1:100000 IJ SOLN
INTRAMUSCULAR | Status: AC
  Filled 2017-03-09: qty 1

## 2017-03-09 MED ORDER — HYDROCODONE-ACETAMINOPHEN 5-325 MG PO TABS
2.0000 | ORAL_TABLET | Freq: Once | ORAL | Status: AC
  Administered 2017-03-09: 2 via ORAL
  Filled 2017-03-09: qty 2

## 2017-03-09 MED ORDER — DOXYCYCLINE HYCLATE 100 MG PO CAPS: 100.0000 mg | ORAL_CAPSULE | Freq: Two times a day (BID) | ORAL | 0 refills | Status: AC

## 2017-03-09 MED ORDER — IBUPROFEN 800 MG PO TABS
800.0000 mg | ORAL_TABLET | Freq: Once | ORAL | Status: AC
  Administered 2017-03-09: 800 mg via ORAL
  Filled 2017-03-09: qty 1

## 2017-03-09 MED ORDER — HYDROCODONE-ACETAMINOPHEN 5-325 MG PO TABS: 1.0000 | ORAL_TABLET | Freq: Four times a day (QID) | ORAL | 0 refills | Status: AC | PRN

## 2017-03-09 NOTE — ED Provider Notes (Signed)
[facility]   CSN: [id number] Arrival date & time: 03/09/17  0008     History   Chief Complaint Chief Complaint  Patient presents with  . Abscess    HPI Hannah Riddle is a 26 y.o. female.  The history is provided by the patient.  Abscess  Location:  Shoulder/arm (buttock and breast) Shoulder/arm abscess location:  L axilla Abscess quality: draining and painful   Progression:  Worsening Pain details:    Quality:  Pressure   Severity:  Moderate   Timing:  Constant   Progression:  Worsening Chronicity:  New Context: not diabetes   Associated symptoms: no fever and no vomiting   patient presents with multiple abscess on her body She reports there is one on left axilla, top of her buttocks and breasts No fever/vomiting She reports previously h/o abscesses  Past Medical History:  Diagnosis Date  . Asthma     There are no active problems to display for this patient.   History reviewed. No pertinent surgical history.  OB History    No data available       Home Medications    Prior to Admission medications   Medication Sig Start Date End Date Taking? Authorizing Provider  albuterol (PROVENTIL HFA;VENTOLIN HFA) 108 (90 BASE) MCG/ACT inhaler Inhale into the lungs every 6 (six) hours as needed for wheezing or shortness of breath.    [provider]  diazepam (VALIUM) 5 MG tablet Take 1 tablet (5 mg total) by mouth 2 (two) times daily. 01/09/14   Gerhard Munch, MD  ibuprofen (ADVIL,MOTRIN) 600 MG tablet Take 1 tablet (600 mg total) by mouth every 6 (six) hours as needed. 05/21/16   Barrett Henle, PA-C    Family History No family history on file.  Social History Social History  Substance Use Topics  . Smoking status: Never Smoker  . Smokeless tobacco: Never Used  . Alcohol use No     Allergies   Patient has no known allergies.   Review of Systems Review of Systems  Constitutional: Negative for fever.    Respiratory: Negative for cough.   Gastrointestinal: Negative for vomiting.  Musculoskeletal: Positive for arthralgias.  Skin: Positive for wound.  All other systems reviewed and are negative.    Physical Exam Updated Vital Signs BP 126/70 (BP Location: Left Arm)   Pulse 91   Temp 99.7 F (37.6 C) (Oral)   Resp 14   Ht 1.575 m (5\' 2" )   Wt 99.8 kg (220 lb)   LMP 03/07/2017 (Exact Date)   SpO2 96%   BMI 40.24 kg/m   Physical Exam  CONSTITUTIONAL: Well developed/well nourished HEAD: Normocephalic/atraumatic ENMT: Mucous membranes moist NECK: supple no meningeal signs CV: S1/S2 noted, no murmurs/rubs/gallops noted LUNGS: Lungs are clear to auscultation bilaterally, no apparent distress ABDOMEN: soft, nontender NEURO: Pt is awake/alert/appropriate, moves all extremitiesx4.  No facial droop.   EXTREMITIES: pulses normal/equal, full ROM SKIN: warm, color normal Small lesion noted to superior gluteal cleft, no active drainage Area on right breast is without drainage/erythema/fluctuance There is a fluctuant abscess to left axilla PSYCH: no abnormalities of mood noted, alert and oriented to situation  ED Treatments / Results  Labs (all labs ordered are listed, but only abnormal results are displayed) Labs Reviewed  CBG MONITORING, ED - Abnormal; Notable for the following:       Result Value   Glucose-Capillary 162 (*)    All other components within normal limits  EKG  EKG Interpretation None       Radiology No results found.  Procedures Procedures  INCISION AND DRAINAGE Performed by: Joya GaskinsWICKLINE,Leeum Sankey W Consent: Verbal consent obtained. Risks and benefits: risks, benefits and alternatives were discussed Type: abscess  Body area: left axilla  Anesthesia: local infiltration  Incision was made with a scalpel.  Local anesthetic: lidocaine %  epinephrine  Anesthetic total: 4 ml  Complexity: complex Blunt dissection to break up loculations  Drainage:  purulent  Drainage amount: moderate pus  Packing material: 1/4 in iodoform gauze  Patient tolerance: Patient tolerated the procedure well with no immediate complications.     Medications Ordered in ED Medications  lidocaine-EPINEPHrine (XYLOCAINE W/EPI) 1 %-1:100000 (with pres) injection (not administered)  ibuprofen (ADVIL,MOTRIN) tablet 800 mg (not administered)  morphine 4 MG/ML injection 4 mg (not administered)  doxycycline (VIBRA-TABS) tablet 100 mg (100 mg Oral Given 03/09/17 0140)  HYDROcodone-acetaminophen (NORCO/VICODIN) 5-325 MG per tablet 2 tablet (2 tablets Oral Given 03/09/17 0140)     Initial Impression / Assessment and Plan / ED Course  I have reviewed the triage vital signs and the nursing notes.  Pertinent labs results that were available during my care of the patient were reviewed by me and considered in my medical decision making (see chart for details).     Abscess packed Will start antibiotics Wound on breast/buttock not amenable to drainage Will refer to general surgery   Final Clinical Impressions(s) / ED Diagnoses   Final diagnoses:  Abscess of left axilla    New Prescriptions New Prescriptions   DOXYCYCLINE (VIBRAMYCIN) 100 MG CAPSULE    Take 1 capsule (100 mg total) by mouth 2 (two) times daily. One po bid x 7 days   HYDROCODONE-ACETAMINOPHEN (NORCO/VICODIN) 5-325 MG TABLET    Take 1 tablet by mouth every 6 (six) hours as needed for severe pain.     Zadie RhineWickline, Ozil Stettler, MD 03/09/17 [id number]

## 2017-03-09 NOTE — ED Notes (Signed)
Pt reports 3 abscesses; 1 under right breast, 1 under left arm, and 1 on the top of her butt crack. Pt reports the one on her butt has been there for 2 years. Pt reports the other 2 come and go every 3 months.

## 2017-03-09 NOTE — ED Triage Notes (Signed)
Pt reports abscess in L axilla, under R breast, and at the top of the gluteal cleft.  Pt reports they are painful.

## 2020-10-19 ENCOUNTER — Other Ambulatory Visit: Payer: Self-pay

## 2020-10-30 ENCOUNTER — Emergency Department (HOSPITAL_BASED_OUTPATIENT_CLINIC_OR_DEPARTMENT_OTHER)
Admission: EM | Admit: 2020-10-30 | Discharge: 2020-10-30 | Disposition: A | Discharge status: Home / Self Care | Payer: Self-pay | Attending: Emergency Medicine | Admitting: Emergency Medicine

## 2020-10-30 ENCOUNTER — Other Ambulatory Visit: Payer: Self-pay

## 2020-10-30 ENCOUNTER — Encounter (HOSPITAL_BASED_OUTPATIENT_CLINIC_OR_DEPARTMENT_OTHER): Payer: Self-pay | Admitting: Emergency Medicine

## 2020-10-30 DIAGNOSIS — H0289 Other specified disorders of eyelid: Secondary | ICD-10-CM

## 2020-10-30 DIAGNOSIS — H02843 Edema of right eye, unspecified eyelid: Secondary | ICD-10-CM

## 2020-10-30 DIAGNOSIS — J45909 Unspecified asthma, uncomplicated: Secondary | ICD-10-CM | POA: Insufficient documentation

## 2020-10-30 DIAGNOSIS — H02841 Edema of right upper eyelid: Secondary | ICD-10-CM | POA: Insufficient documentation

## 2020-10-30 MED ORDER — FLUORESCEIN SODIUM 1 MG OP STRP
1.0000 | ORAL_STRIP | Freq: Once | OPHTHALMIC | Status: AC
  Administered 2020-10-30: 1 via OPHTHALMIC
  Filled 2020-10-30: qty 1

## 2020-10-30 MED ORDER — TETRACAINE HCL 0.5 % OP SOLN
2.0000 [drp] | Freq: Once | OPHTHALMIC | Status: AC
  Administered 2020-10-30: 2 [drp] via OPHTHALMIC
  Filled 2020-10-30: qty 4

## 2020-10-30 MED ORDER — CLINDAMYCIN HCL 150 MG PO CAPS
450.0000 mg | ORAL_CAPSULE | Freq: Once | ORAL | Status: AC
  Administered 2020-10-30: 450 mg via ORAL
  Filled 2020-10-30: qty 3

## 2020-10-30 MED ORDER — CLINDAMYCIN HCL 150 MG PO CAPS: 300.0000 mg | ORAL_CAPSULE | Freq: Three times a day (TID) | ORAL | 0 refills | Status: AC

## 2020-10-30 NOTE — ED Provider Notes (Signed)
[facility] EMERGENCY DEPARTMENT Provider Note   CSN: [id number] Arrival date & time: 10/30/20  1248     History Chief Complaint  Patient presents with  . Eye Pain    Hannah Riddle is a 31 y.o. female.  HPI Patient is a 30 year old female who presents to the emergency department with right eye swelling.  Patient states her symptoms started about 2 days ago and she was initially experiencing right eye itching.  She notes a point of mild swelling along the medial aspect of the right upper eyelid.  She states this began draining last night.  For the past 24 hours she began experiencing moderate swelling along the upper and lower eyelids of the right eye.  She states she was initially having some right eye pain but this is since resolved.  No right eye redness.  Increased lacrimation without eye drainage.  No photophobia.  She wears glasses at baseline but denies any contact lens use.  No fevers, chills.  No pain with extraocular movements, per patient.    Past Medical History:  Diagnosis Date  . Asthma     There are no problems to display for this patient.   History reviewed. No pertinent surgical history.   OB History   No obstetric history on file.     History reviewed. No pertinent family history.  Social History   Tobacco Use  . Smoking status: Never Smoker  . Smokeless tobacco: Never Used  Substance Use Topics  . Alcohol use: No  . Drug use: No    Home Medications Prior to Admission medications   Medication Sig Start Date End Date Taking? Authorizing Provider  clindamycin (CLEOCIN) 150 MG capsule Take 2 capsules (300 mg total) by mouth 3 (three) times daily for 7 days. 10/30/20 11/06/20 Yes Placido Sou, PA-C  albuterol (PROVENTIL HFA;VENTOLIN HFA) 108 (90 BASE) MCG/ACT inhaler Inhale into the lungs every 6 (six) hours as needed for wheezing or shortness of breath.    [provider]  doxycycline (VIBRAMYCIN) 100 MG capsule Take 1 capsule (100  mg total) by mouth 2 (two) times daily. One po bid x 7 days 03/09/17   Zadie Rhine, MD  HYDROcodone-acetaminophen (NORCO/VICODIN) 5-325 MG tablet Take 1 tablet by mouth every 6 (six) hours as needed for severe pain. 03/09/17   Zadie Rhine, MD    Allergies    Patient has no known allergies.  Review of Systems   Review of Systems  All other systems reviewed and are negative. Ten systems reviewed and are negative for acute change, except as noted in the HPI.   Physical Exam Updated Vital Signs BP 115/81 (BP Location: Left Arm)   Pulse 70   Temp 98.7 F (37.1 C) (Oral)   Resp 18   Ht 5\' 2"  (1.575 m)   Wt 97.5 kg   LMP 10/12/2020   SpO2 96%   BMI 39.32 kg/m   Physical Exam Vitals and nursing note reviewed.  Constitutional:      General: She is not in acute distress.    Appearance: Normal appearance. She is not ill-appearing, toxic-appearing or diaphoretic.  HENT:     Head: Normocephalic and atraumatic.     Right Ear: External ear normal.     Left Ear: External ear normal.     Nose: Nose normal.     Mouth/Throat:     Mouth: Mucous membranes are moist.     Pharynx: Oropharynx is clear. No oropharyngeal exudate or posterior oropharyngeal erythema.  Eyes:     General: No scleral icterus.       Right eye: No discharge.        Left eye: No discharge.     Extraocular Movements: Extraocular movements intact.     Conjunctiva/sclera: Conjunctivae normal.     Pupils: Pupils are equal, round, and reactive to light.     Comments: Moderate swelling noted along the right upper and lower eyelid.  Mild redness noted to the right upper eyelid.  Small pustule noted to the medial aspect of the right upper eyelid.  Appears dry and crusted over now.  Mild tenderness along the region.  No pain with extraocular movements.  Eyes not injected.  Pupils are equal, round, and reactive to light.  Visual fields are intact.  Cardiovascular:     Rate and Rhythm: Normal rate and regular rhythm.      Pulses: Normal pulses.  Pulmonary:     Effort: Pulmonary effort is normal.  Abdominal:     General: Abdomen is flat. There is no distension.  Musculoskeletal:        General: Normal range of motion.     Cervical back: Normal range of motion and neck supple. No tenderness.  Skin:    General: Skin is warm and dry.  Neurological:     General: No focal deficit present.     Mental Status: She is alert and oriented to person, place, and time.  Psychiatric:        Mood and Affect: Mood normal.        Behavior: Behavior normal.    ED Results / Procedures / Treatments   Labs (all labs ordered are listed, but only abnormal results are displayed) Labs Reviewed - No data to display  EKG None  Radiology No results found.  Procedures Procedures (including critical care time)  Medications Ordered in ED Medications  clindamycin (CLEOCIN) capsule 450 mg (has no administration in time range)  fluorescein ophthalmic strip 1 strip (1 strip Right Eye Given 10/30/20 1522)  tetracaine (PONTOCAINE) 0.5 % ophthalmic solution 2 drop (2 drops Right Eye Given 10/30/20 1522)    ED Course  I have reviewed the triage vital signs and the nursing notes.  Pertinent labs & imaging results that were available during my care of the patient were reviewed by me and considered in my medical decision making (see chart for details).  Clinical Course as of 10/30/20 1624  Sat Oct 30, 2020  1537 30 yo female here with right eye swelling, onset yesterday into today.  She reports scratching at the medial aspect of right eye prior to this.  She wears glasses, no contacts.  Denies eye pain or blurred vision.  Denies fevers.  NKDA, no other medical problems.  ON exam she's comfortable appearing.  Has edema largely involving the upper eyelid, with a small region of induration on the medial aspect of the right eye near the lacrimal duct.  Differential includes preorbital cellulitis vs dacryocystitis vs possible  dacryoadenitis.  I would treat for infection with Clinda for 7 days, advised warm compresses to eye.  At this time no evidence of orbital cellulitis or deep space infection.  No sign of ocular involvement.  Vision is at baseline. [MT]    Clinical Course User Index [MT] Trifan, Kermit Balo, MD   MDM Rules/Calculators/A&P                          Patient is a  30 year old female who presents the emergency department with what appears to be a likely dacryocystitis versus dacryoadenitis.  No visual changes or contact lens use.  Eyes not injected.  No pain with extraocular movements.  Doubt orbital cellulitis or corneal abrasion at this time.  Patient does have a small pustule to the medial aspect of the upper eyelid of the eye.  Patient felt that this was draining last night but is since resolved.  Recommended continued use of warm compresses to the eye.  Will discharge on a course of clindamycin for the next 7 days.  First dose given in the emergency department.  Discussed return precautions in length.  Patient given ophthalmology follow-up.  Her questions were answered and she was amicable at the time of discharge.  Pt discussed with and evaluated by my attending physician Dr. Alvester Chou who is in agreement with the above plan.   Final Clinical Impression(s) / ED Diagnoses Final diagnoses:  Pain and swelling of eyelid of right eye   Rx / DC Orders ED Discharge Orders         Ordered    clindamycin (CLEOCIN) 150 MG capsule  3 times daily        10/30/20 1622           Placido Sou, PA-C 10/30/20 1624    Terald Sleeper, MD 10/31/20 [phone]

## 2020-10-30 NOTE — ED Triage Notes (Signed)
Pt arrives pov with c/o right eye pain and swelling x 2 days. Pt endorses tearing, denies injury. Redness and swelling noted.

## 2020-10-30 NOTE — Discharge Instructions (Addendum)
Like we discussed I am prescribing you an antibiotic called clindamycin. You are going to take this 3x per day for the next 7 days. Please do not stop taking this early. Continue to apply warm compresses to the eye as well.   I have given you a referral to a local eye doctor. Their contact information is below. Please give them a call to schedule a follow up appointment.  Please return to the ER with any new or worsening symptoms. It was a pleasure to meet you.
